# Patient Record
Sex: Male | Born: 2000 | Race: White | Hispanic: No | Marital: Single | Smoking: Former smoker
Health system: Southern US, Community
[De-identification: ages and names within clinical notes are randomized; demographics above are authoritative.]

---
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
## 2019-10-23 ENCOUNTER — Ambulatory Visit (INDEPENDENT_AMBULATORY_CARE_PROVIDER_SITE_OTHER): Payer: No Typology Code available for payment source

## 2019-10-23 ENCOUNTER — Encounter: Payer: Self-pay | Admitting: Sports Medicine

## 2019-10-23 ENCOUNTER — Other Ambulatory Visit: Payer: Self-pay

## 2019-10-23 ENCOUNTER — Ambulatory Visit (INDEPENDENT_AMBULATORY_CARE_PROVIDER_SITE_OTHER): Payer: No Typology Code available for payment source | Admitting: Sports Medicine

## 2019-10-23 DIAGNOSIS — M5441 Lumbago with sciatica, right side: Secondary | ICD-10-CM

## 2019-10-23 DIAGNOSIS — M545 Low back pain, unspecified: Secondary | ICD-10-CM | POA: Insufficient documentation

## 2019-10-23 NOTE — Progress Notes (Signed)
    Procedures performed today:    None.  Independent interpretation of notes and tests performed by another provider:   None.  Brief History, Exam, Impression, and Recommendations:    Clifford Li is a pleasant 19 yo male that presents with back pain that began 2 weekends ago when he was deadlifiting and heard a pop in his back while on vacation in [place]. He felt some pain and felt stiff. He drove home two days later which exacerbated the pain. He says he is no tin much pain at rest but as he walks or bends over it hurts more. He has no clonus in his feet and good achiles and patellar reflexes. This is most likely a  Herniated cervical disk. We are going to get an Xray today. He declines any pain or anti-inflamatory medications. We are going to prescribe PT which he will do at a place near his college, [place]. He will follow up in 6 weeks for reevaluation.   [name], MS3   ___________________________________________ Ihor Austin. Benjamin Stain, M.D., ABFM., CAQSM. Primary Care and Sports Medicine [facility]  Adjunct Instructor of Family Medicine  [employer]

## 2019-10-23 NOTE — Assessment & Plan Note (Signed)
Clifford Li is a pleasant 19 year old male, he is in college at [employer], he was doing a dead lift and felt a pop, he had severe pain, worse with sitting, flexion, Valsalva, no progressive weakness, bowel or bladder dysfunction, saddle numbness, we will treat this conservatively, physical therapy, x-rays. Return to see me in 4 to 6 weeks, MRI for interventional planning if no better.

## 2021-01-30 IMAGING — DX DG LUMBAR SPINE COMPLETE 4+V
5 series · 5 of 5 positions shown · non-contrast
Comparison: None.

CLINICAL DATA: Pain after heavy lifting

EXAM:
LUMBAR SPINE - COMPLETE 4+ VIEW

[l-spine ap]
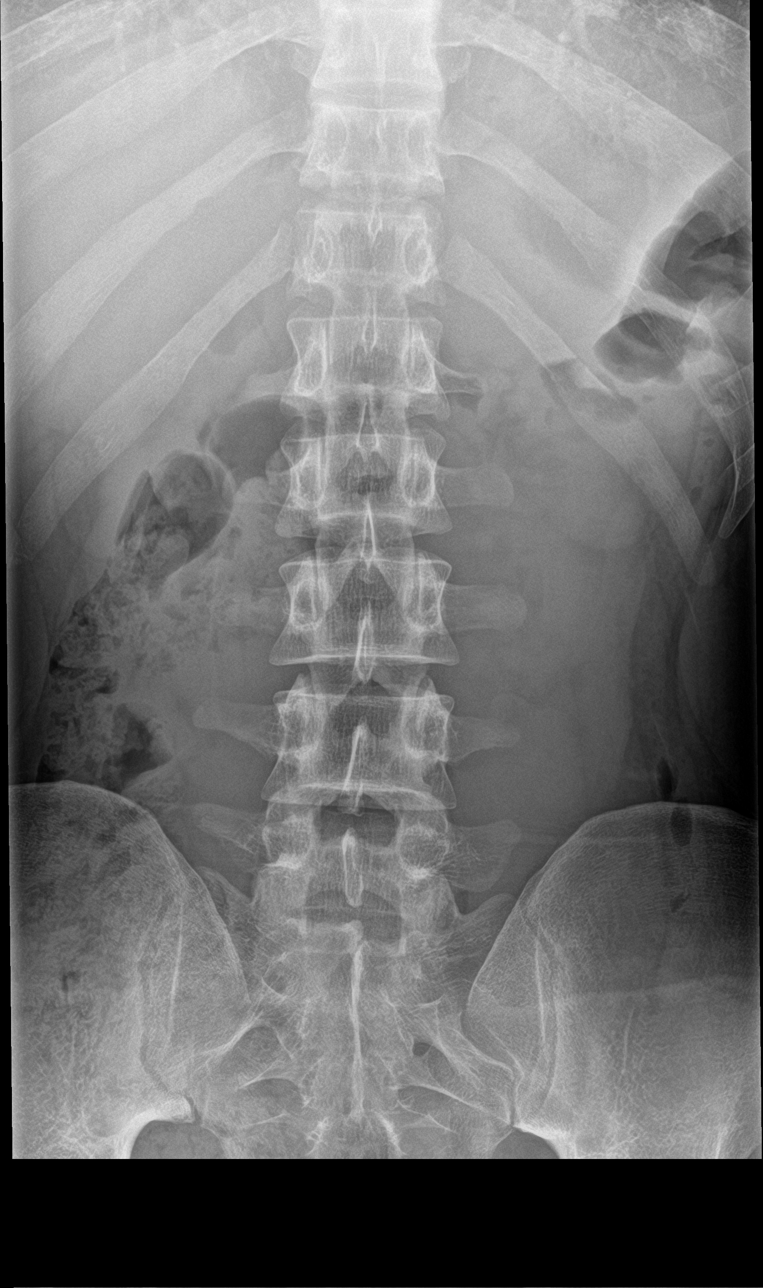

[l-spine obl (1 of 2)]
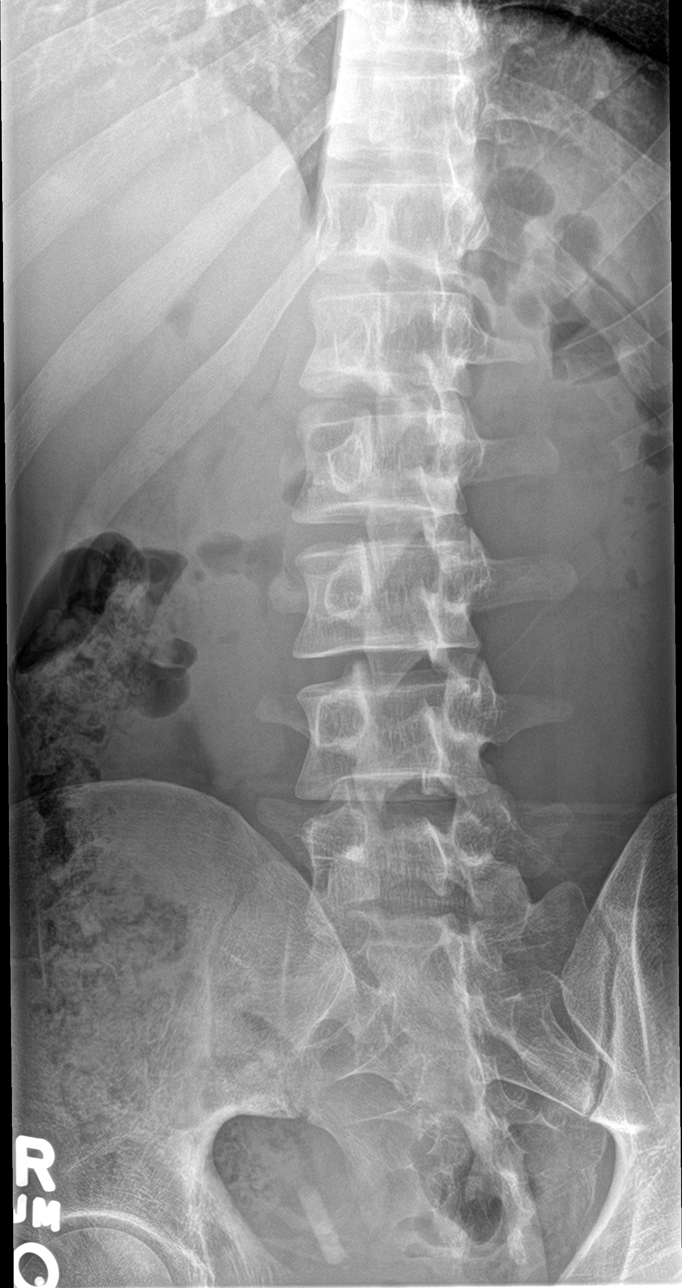

[l-spine obl (2 of 2)]
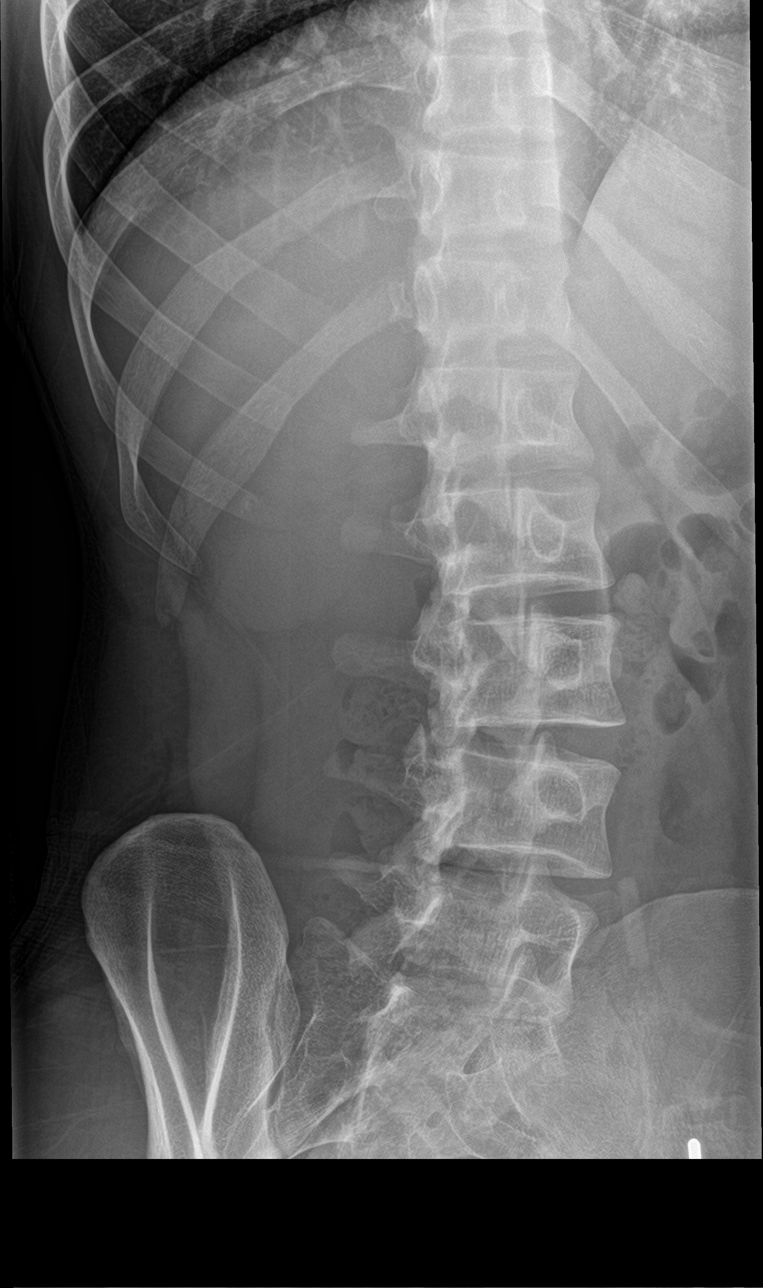

[l-spine lat]
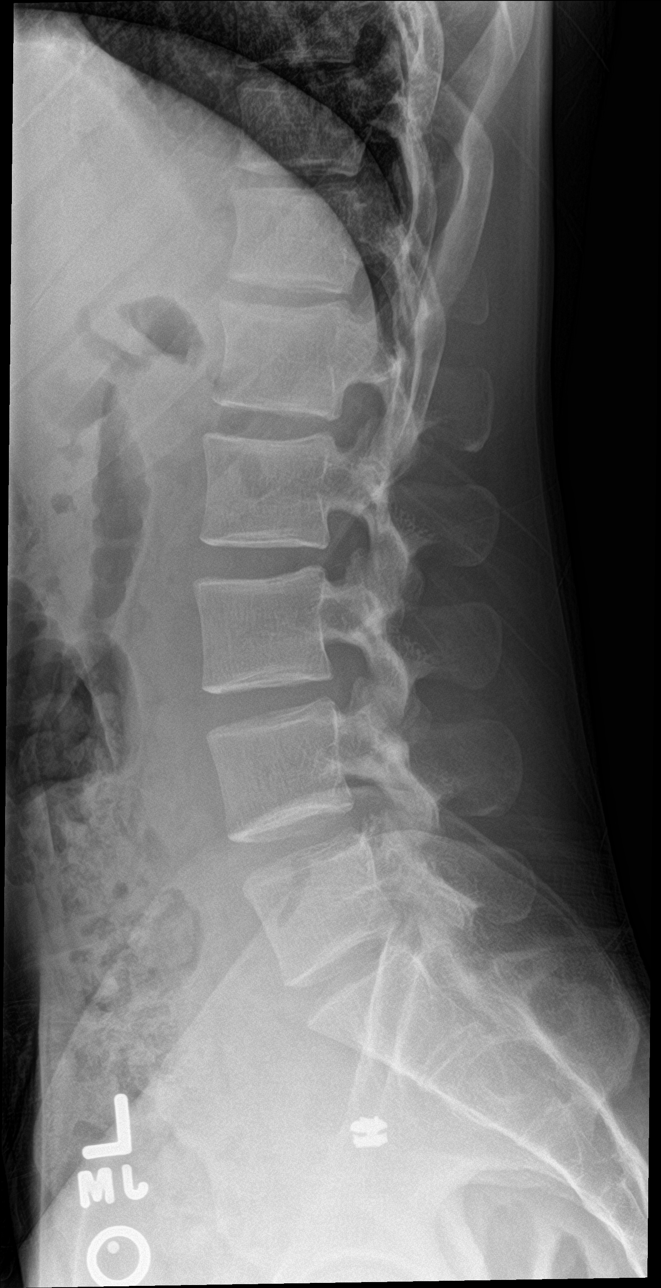

[l-spine spot]
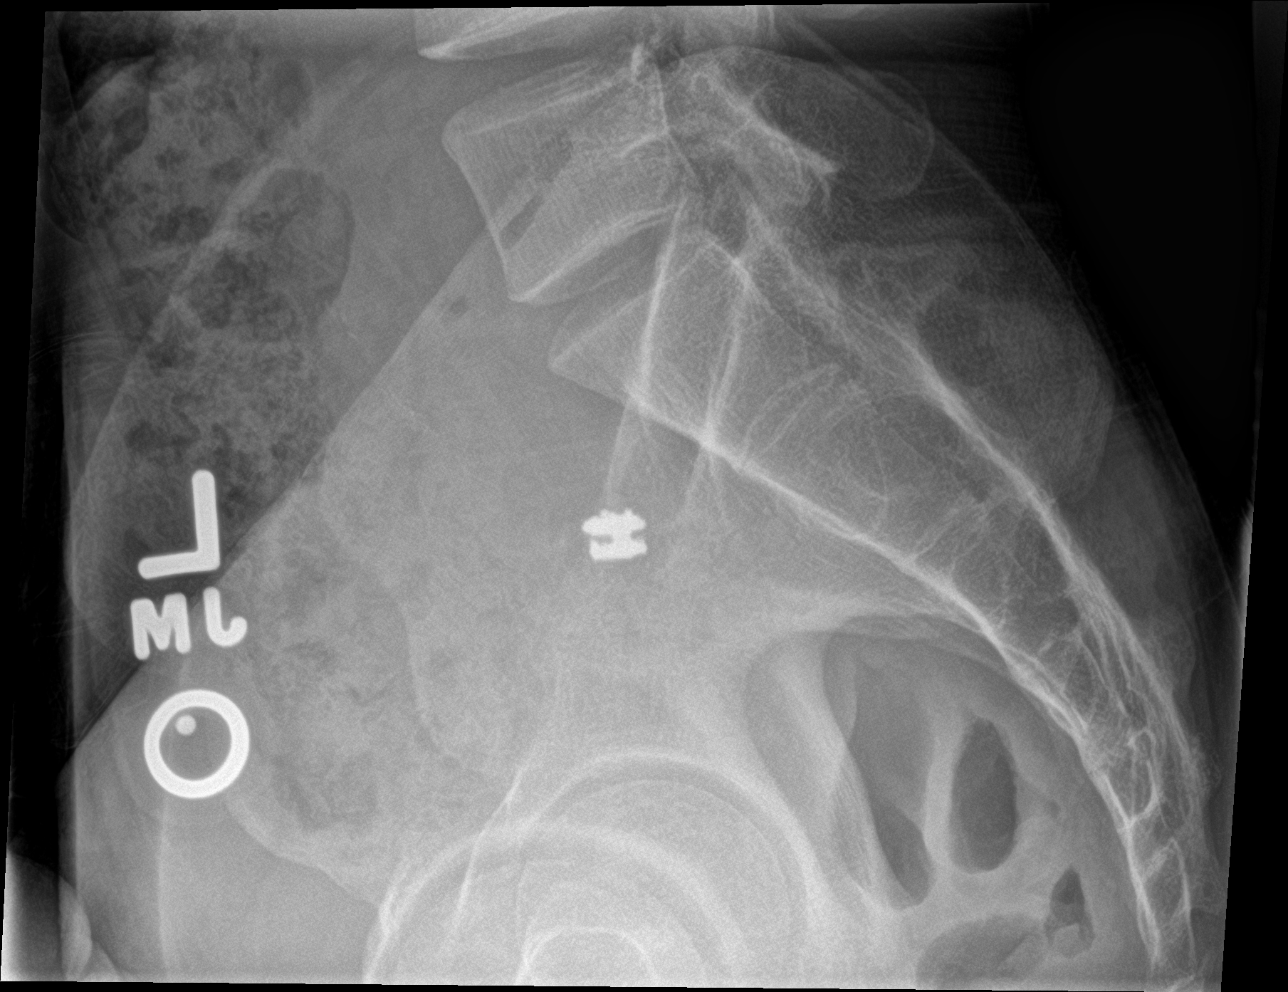

[5 of 5 positions shown; findings below may reference images not displayed]

FINDINGS: Frontal, lateral, spot lumbosacral lateral, and bilateral oblique
views were obtained. There there are 5 non-rib-bearing lumbar type
vertebral bodies. No fracture or spondylolisthesis. The disc spaces
appear normal. There is no appreciable exit foraminal narrowing on
the oblique views.
IMPRESSION: No fracture or spondylolisthesis.  No evident arthropathy.
